# Patient Record
Sex: Female | Born: 1980 | Race: White | Hispanic: No | Marital: Married | Smoking: Never smoker
Health system: Southern US, Community
[De-identification: ages and names within clinical notes are randomized; demographics above are authoritative.]

## PROBLEM LIST (undated history)

## (undated) DIAGNOSIS — R51 Headache: Secondary | ICD-10-CM

## (undated) DIAGNOSIS — E079 Disorder of thyroid, unspecified: Secondary | ICD-10-CM

## (undated) DIAGNOSIS — I493 Ventricular premature depolarization: Principal | ICD-10-CM

## (undated) DIAGNOSIS — R202 Paresthesia of skin: Secondary | ICD-10-CM

## (undated) DIAGNOSIS — R200 Anesthesia of skin: Secondary | ICD-10-CM

## (undated) DIAGNOSIS — R519 Headache, unspecified: Secondary | ICD-10-CM

## (undated) DIAGNOSIS — O09529 Supervision of elderly multigravida, unspecified trimester: Secondary | ICD-10-CM

## (undated) DIAGNOSIS — O24419 Gestational diabetes mellitus in pregnancy, unspecified control: Secondary | ICD-10-CM

## (undated) DIAGNOSIS — T4145XA Adverse effect of unspecified anesthetic, initial encounter: Secondary | ICD-10-CM

## (undated) DIAGNOSIS — R002 Palpitations: Secondary | ICD-10-CM

## (undated) HISTORY — DX: Disorder of thyroid, unspecified: E07.9

## (undated) HISTORY — PX: MOUTH SURGERY: SHX715

## (undated) HISTORY — DX: Ventricular premature depolarization: I49.3

## (undated) HISTORY — PX: WISDOM TOOTH EXTRACTION: SHX21

## (undated) HISTORY — DX: Palpitations: R00.2

## (undated) HISTORY — PX: DILATION AND CURETTAGE OF UTERUS: SHX78

---

## 2000-01-24 ENCOUNTER — Other Ambulatory Visit: Admission: RE | Admit: 2000-01-24 | Discharge: 2000-01-24 | Payer: Self-pay | Admitting: *Deleted

## 2000-02-23 ENCOUNTER — Encounter (INDEPENDENT_AMBULATORY_CARE_PROVIDER_SITE_OTHER): Payer: Self-pay

## 2000-02-23 ENCOUNTER — Ambulatory Visit (HOSPITAL_COMMUNITY): Admission: RE | Admit: 2000-02-23 | Discharge: 2000-02-23 | Payer: Self-pay | Admitting: Obstetrics and Gynecology

## 2000-05-29 ENCOUNTER — Emergency Department (HOSPITAL_COMMUNITY): Admission: EM | Admit: 2000-05-29 | Discharge: 2000-05-29 | Payer: Self-pay | Admitting: Emergency Medicine

## 2000-05-29 ENCOUNTER — Encounter: Payer: Self-pay | Admitting: Emergency Medicine

## 2006-04-24 DIAGNOSIS — T8859XA Other complications of anesthesia, initial encounter: Secondary | ICD-10-CM

## 2006-04-24 HISTORY — DX: Other complications of anesthesia, initial encounter: T88.59XA

## 2009-10-25 ENCOUNTER — Emergency Department (HOSPITAL_COMMUNITY): Admission: EM | Admit: 2009-10-25 | Discharge: 2009-10-25 | Payer: Self-pay | Admitting: Emergency Medicine

## 2009-10-27 ENCOUNTER — Observation Stay (HOSPITAL_COMMUNITY): Admission: EM | Admit: 2009-10-27 | Discharge: 2009-10-27 | Payer: Self-pay | Admitting: Emergency Medicine

## 2010-07-10 LAB — POCT I-STAT, CHEM 8
BUN: 13 mg/dL (ref 6–23)
Calcium, Ion: 1.17 mmol/L (ref 1.12–1.32)
Chloride: 105 mEq/L (ref 96–112)
Creatinine, Ser: 0.8 mg/dL (ref 0.4–1.2)
Glucose, Bld: 104 mg/dL — ABNORMAL HIGH (ref 70–99)
HCT: 41 % (ref 36.0–46.0)
Hemoglobin: 13.9 g/dL (ref 12.0–15.0)
Potassium: 4.5 mEq/L (ref 3.5–5.1)
Sodium: 139 mEq/L (ref 135–145)
TCO2: 25 mmol/L (ref 0–100)

## 2010-07-10 LAB — CBC
HCT: 37.2 % (ref 36.0–46.0)
Hemoglobin: 12.8 g/dL (ref 12.0–15.0)
MCH: 31 pg (ref 26.0–34.0)
MCHC: 34.4 g/dL (ref 30.0–36.0)
MCV: 90.3 fL (ref 78.0–100.0)
Platelets: 267 10*3/uL (ref 150–400)
RBC: 4.13 MIL/uL (ref 3.87–5.11)
RDW: 12.6 % (ref 11.5–15.5)
WBC: 17.5 10*3/uL — ABNORMAL HIGH (ref 4.0–10.5)

## 2010-07-10 LAB — DIFFERENTIAL
Basophils Absolute: 0 10*3/uL (ref 0.0–0.1)
Basophils Relative: 0 % (ref 0–1)
Eosinophils Absolute: 0.1 10*3/uL (ref 0.0–0.7)
Eosinophils Relative: 1 % (ref 0–5)
Lymphocytes Relative: 19 % (ref 12–46)
Lymphs Abs: 3.3 10*3/uL (ref 0.7–4.0)
Monocytes Absolute: 1.1 10*3/uL — ABNORMAL HIGH (ref 0.1–1.0)
Monocytes Relative: 6 % (ref 3–12)
Neutro Abs: 12.9 10*3/uL — ABNORMAL HIGH (ref 1.7–7.7)
Neutrophils Relative %: 74 % (ref 43–77)

## 2010-07-10 LAB — PREGNANCY, URINE: Preg Test, Ur: NEGATIVE

## 2010-07-10 LAB — POCT PREGNANCY, URINE: Preg Test, Ur: NEGATIVE

## 2010-07-20 ENCOUNTER — Encounter: Payer: Medicaid Other | Attending: Obstetrics and Gynecology

## 2010-07-20 DIAGNOSIS — Z713 Dietary counseling and surveillance: Secondary | ICD-10-CM | POA: Insufficient documentation

## 2010-07-20 DIAGNOSIS — O99810 Abnormal glucose complicating pregnancy: Secondary | ICD-10-CM | POA: Insufficient documentation

## 2010-08-31 ENCOUNTER — Inpatient Hospital Stay (HOSPITAL_COMMUNITY)
Admission: AD | Admit: 2010-08-31 | Discharge: 2010-08-31 | Disposition: A | Discharge status: Home / Self Care | Payer: Managed Care, Other (non HMO) | Source: Ambulatory Visit | Attending: Obstetrics and Gynecology | Admitting: Obstetrics and Gynecology

## 2010-08-31 DIAGNOSIS — O479 False labor, unspecified: Secondary | ICD-10-CM | POA: Insufficient documentation

## 2010-09-06 ENCOUNTER — Inpatient Hospital Stay (HOSPITAL_COMMUNITY)
Admission: RE | Admit: 2010-09-06 | Discharge: 2010-09-08 | DRG: 775 | Disposition: A | Discharge status: Home / Self Care | Payer: Managed Care, Other (non HMO) | Source: Ambulatory Visit | Attending: Obstetrics and Gynecology | Admitting: Obstetrics and Gynecology

## 2010-09-06 DIAGNOSIS — O99814 Abnormal glucose complicating childbirth: Principal | ICD-10-CM | POA: Diagnosis present

## 2010-09-06 LAB — CBC
HCT: 37.8 % (ref 36.0–46.0)
MCV: 92.2 fL (ref 78.0–100.0)
RBC: 4.1 MIL/uL (ref 3.87–5.11)
RDW: 13.4 % (ref 11.5–15.5)
WBC: 12.5 10*3/uL — ABNORMAL HIGH (ref 4.0–10.5)

## 2010-09-06 LAB — RPR: RPR Ser Ql: NONREACTIVE

## 2010-09-07 LAB — CBC
HCT: 32.2 % — ABNORMAL LOW (ref 36.0–46.0)
MCV: 92.3 fL (ref 78.0–100.0)
Platelets: 184 10*3/uL (ref 150–400)
RBC: 3.49 MIL/uL — ABNORMAL LOW (ref 3.87–5.11)
RDW: 13.5 % (ref 11.5–15.5)
WBC: 17.2 10*3/uL — ABNORMAL HIGH (ref 4.0–10.5)

## 2010-09-09 NOTE — H&P (Signed)
[facility]  Patient:    Donna Rivera, Donna Rivera                 MRN: [id number] Adm. Date:  [id number] Attending:  Lenoard Aden                         History and Physical  PREOPERATIVE DIAGNOSIS:       Missed Ab at 10 weeks.  HISTORY OF PRESENT ILLNESS:   Patient is a 30 year old white female, G2, P0 -- EDD is Sep 03, 2000 by LMP -- who, on ultrasound, has obvious blighted ovum and anembryonic gestation.  She is for definitive therapy.  PAST MEDICAL HISTORY:         Noncontributory.  MEDICATIONS:                  History of Naprosyn use for menstrual cramps.  ALLERGIES:                    ASPIRIN.  SOCIAL HISTORY:               She is a nonsmoker and nondrinker.  She denies domestic or physical violence.  FAMILY HISTORY:               Noncontributory.  PHYSICAL EXAMINATION  GENERAL:                      Well-developed, well-nourished white female in no apparent distress.  HEENT:                        Normal.  LUNGS:                        Clear.  HEART:                        Regular rhythm.  ABDOMEN:                      Soft, nontender.  Uterus 6 to 8 weeks size. No adnexal masses appreciated.  IMPRESSION:                   Missed abortion.  PLAN:                         Proceed with suction/D&E.  Risks of anesthesia, infection, bleeding, uterine perforation with need for repair are discussed; injury to abdominal organs to include bowel and bladder injury with need for repair and possible need for blood transfusion noted; patient acknowledges and desires to proceed. DD:  02/23/00 TD:  02/23/00 Job: [id number]

## 2010-09-09 NOTE — Op Note (Signed)
[facility]  Patient:    Donna Rivera, FLEIG                   MRN: [id number] Proc. Date: 02/23/00 Attending:  Lenoard Aden, M.D.                           Operative Report  PREOPERATIVE DIAGNOSIS:       Missed abortion at 10 weeks.  POSTOPERATIVE DIAGNOSIS:      Missed abortion at 10 weeks.  PROCEDURE:                    Suction dilation and evacuation.  SURGEON:                      Lenoard Aden, M.D.  ANESTHESIA:                   MAC, paracervical.  ESTIMATED BLOOD LOSS:         Less than 50 cc.  COMPLICATIONS:                None.  DRAIN:                        None.  COUNTS:                       Correct.  Patient recovering in good condition.  BRIEF NOTE:                   After being apprised of the risks of anesthesia, infection, bleeding, uterine perforation, ______ patient presents to the operating room where she is administered IV sedation without difficulty, prepped and draped in the usual sterile fashion.  Examination under anesthesia reveals a mid positioned 8 weeks sized uterus.  No adnexal masses.  Patient is catheterized until bladder is empty.  Vivelle speculum placed.  Paracervical block placed in a standard fashion with 20 cc 2% Xylocaine solution.  No complications.  Uterus sounds to 10 cm.  Dilated up to #[facility] dilator.  A 7 mm suction curette placed.  Products of conception noted on suction.  Minimal tissue noted.  At this time good hemostasis achieved.  Blunt curettage in a four quadrant method reveals the cavity to be empty.  Repeat suction and curettage confirms.  Patient tolerates procedure well.  Estimated blood loss less than 50 cc.  Transferred to recovery in good condition. DD:  02/23/00 TD:  02/23/00 Job: [id number]

## 2010-09-14 ENCOUNTER — Inpatient Hospital Stay (HOSPITAL_COMMUNITY): Admission: AD | Admit: 2010-09-14 | Payer: Self-pay | Admitting: Obstetrics and Gynecology

## 2011-12-24 ENCOUNTER — Ambulatory Visit: Payer: Managed Care, Other (non HMO)

## 2012-10-03 ENCOUNTER — Ambulatory Visit (INDEPENDENT_AMBULATORY_CARE_PROVIDER_SITE_OTHER): Payer: Managed Care, Other (non HMO) | Admitting: Internal Medicine

## 2012-10-03 ENCOUNTER — Encounter: Payer: Self-pay | Admitting: Internal Medicine

## 2012-10-03 VITALS — BP 114/68 | HR 76 | Ht 65.0 in | Wt 190.8 lb

## 2012-10-03 DIAGNOSIS — R002 Palpitations: Secondary | ICD-10-CM

## 2012-10-03 NOTE — Assessment & Plan Note (Addendum)
The patient has very striking palpitations which are interestingly associated with a relatively slow heart rate. Is possible that she is under counseling and that she has SVT. I wonder also whether she might not have AI VR.  We'll undertake an event recorder to try to elucidate the mechanism. For right now I would not undertake therapy or other diagnostic testing.

## 2012-10-03 NOTE — Patient Instructions (Addendum)
Your physician has recommended that you wear an event monitor. Event monitors are medical devices that record the heart's electrical activity. Doctors most often [place] these monitors to diagnose arrhythmias. Arrhythmias are problems with the speed or rhythm of the heartbeat. The monitor is a small, portable device. You can wear one while you do your normal daily activities. This is usually used to diagnose what is causing palpitations/syncope (passing out).  Your physician recommends that you schedule a follow-up appointment in: 5-6 weeks

## 2012-10-03 NOTE — Progress Notes (Signed)
ELECTROPHYSIOLOGY CONSULT NOTE  Patient ID: Donna Rivera, MRN: [id number], DOB/AGE: [birth year] 32 y.o. Admit date: (Not on file) Date of Consult: 10/03/2012  Primary Physician: Cala Bradford, MD Primary Cardiologist: new   Chief Complaint: palpitations   HPI Donna Rivera is a 32 y.o. female   Whose  Grandfather and   grandmother I took care of years ago. She comes in with complaints of abrupt onset offset palpitations lasting about 5-10 minutes occurring a couple of times a month. These began about 5 years ago. Interestingly they did not occur while she was pregnant with her child. She has noted no association with caffeine or with exertion.  She has recorded her pulse at about 90-100 beats per minute with this. They are frog negative. She feels them coming primarily in the middle of her chest. They're associated with a sense of impending doom. There is mild lightheadedness modest shortness of breath but no chest discomfort.  She has a history of a thyroid disorder the specifics of which are not available.    No past medical history on file.    Surgical History: No past surgical history on file.   Home Meds: Prior to Admission medications   Medication Sig Start Date End Date Taking? Authorizing Provider  ibuprofen (ADVIL,MOTRIN) 800 MG tablet Take 800 mg by mouth as needed for pain.   Yes Historical Provider, MD  Norethindrone-Ethinyl Estradiol-Fe (GENERESS FE) 0.8-25 MG-MCG tablet Chew 1 tablet by mouth daily. Chew and Swallow one tab by mouth daily   Yes Historical Provider, MD      Allergies:  Allergies  Allergen Reactions  . Aspirin     Unknown reaction  . Penicillins     Unknown reaction    History   Social History  . Marital Status: Married    Spouse Name: N/A    Number of Children: N/A  . Years of Education: N/A   Occupational History  . Not on file.   Social History Main Topics  . Smoking status: Never Smoker   .  Smokeless tobacco: Not on file  . Alcohol Use: Not on file  . Drug Use: Not on file  . Sexually Active: Not on file   Other Topics Concern  . Not on file   Social History Narrative  . No narrative on file     No family history on file.   ROS:  Please see the history of present illness.     All other systems reviewed and negative.    Physical Exam: Blood pressure 114/68, pulse 76, height 5\' 5"  (1.651 m), weight 190 lb 12.8 oz (86.546 kg). General: Well developed, well nourished female in no acute distress. Head: Normocephalic, atraumatic, sclera non-icteric, no xanthomas, nares are without discharge. EENT: normal Lymph Nodes:  none Back: without scoliosis/kyphosis, no CVA tendersness Neck: Negative for carotid bruits. JVD not elevated. Lungs: Clear bilaterally to auscultation without wheezes, rales, or rhonchi. Breathing is unlabored. Heart: RRR with S1 S2. No murmur , rubs, or gallops appreciated. Abdomen: Soft, non-tender, non-distended with normoactive bowel sounds. No hepatomegaly. No rebound/guarding. No obvious abdominal masses. Msk:  Strength and tone appear normal for age. Extremities: No clubbing or cyanosis. No edema.  Distal pedal pulses are 2+ and equal bilaterally. Skin: Warm and Dry Neuro: Alert and oriented X 3. CN III-XII intact Grossly normal sensory and motor function . Psych:  Responds to questions appropriately with a normal affect.      Labs: Cardiac Enzymes No results found  for this basename: CKTOTAL, CKMB, TROPONINI,  in the last 72 hours CBC Lab Results  Component Value Date   WBC 17.2* 09/07/2010   HGB 10.6* 09/07/2010   HCT 32.2* 09/07/2010   MCV 92.3 09/07/2010   PLT 184 09/07/2010     Radiology/Studies:  No results found.  ZOX:WRUEA rhythm at 76 Intervals 03/31/36 Axis LXXXVIII  suggestion no clear evidence of ventricular preexcitation   Assessment and Plan:    Sherryl Manges

## 2012-10-04 ENCOUNTER — Telehealth: Payer: Self-pay | Admitting: Internal Medicine

## 2012-10-04 NOTE — Telephone Encounter (Signed)
New Problem:    Patient called in wanting to let Dr. Graciela Husbands know that after her appointment yesterday she had another episode where her BP was 140/90 but her HR was 142 and it lasted for an hour.  Please call back.

## 2012-10-04 NOTE — Telephone Encounter (Signed)
LMTCB

## 2012-10-04 NOTE — Telephone Encounter (Signed)
**Note De-Identified Daundre Biel Obfuscation** Pt states that after her OV with Dr Graciela Husbands yesterday her BP became elevated at 140/90 with a HR of 142 and that her heart was pounding hard and she felt light headed. She states that she sat down and rested for a while and symptoms subsided. Pt reports her BP this am @ 4:38 was  131/70 with a HR of 88, @ 6:46 BP was 124/75 with a HR of 78 and @ 8:15 BP was 141/80 with a HR of 79. Pt states that she feels ok at this time. Pt is advised to monitor her BP, record readings and to call office if this occurs again and that if this occurs in the evening hours or over the weekend to call 911 or have someone drive her to ER, she verbalized understanding.

## 2012-10-10 ENCOUNTER — Encounter: Payer: Self-pay | Admitting: *Deleted

## 2012-10-10 ENCOUNTER — Encounter (INDEPENDENT_AMBULATORY_CARE_PROVIDER_SITE_OTHER): Payer: Managed Care, Other (non HMO)

## 2012-10-10 DIAGNOSIS — R002 Palpitations: Secondary | ICD-10-CM

## 2012-10-10 NOTE — Progress Notes (Signed)
Patient ID: Donna Rivera, female   DOB: [birth year], 32 y.o.   MRN: [id number] E-Cardio verite 30 day telemetry monitor placed on patient.

## 2012-11-08 ENCOUNTER — Ambulatory Visit: Payer: Managed Care, Other (non HMO) | Admitting: Internal Medicine

## 2012-11-11 ENCOUNTER — Encounter: Payer: Self-pay | Admitting: *Deleted

## 2012-11-12 ENCOUNTER — Ambulatory Visit (INDEPENDENT_AMBULATORY_CARE_PROVIDER_SITE_OTHER): Payer: Managed Care, Other (non HMO) | Admitting: Internal Medicine

## 2012-11-12 ENCOUNTER — Encounter: Payer: Self-pay | Admitting: Internal Medicine

## 2012-11-12 VITALS — BP 143/72 | HR 80 | Ht 63.0 in | Wt 189.8 lb

## 2012-11-12 DIAGNOSIS — I491 Atrial premature depolarization: Secondary | ICD-10-CM | POA: Insufficient documentation

## 2012-11-12 DIAGNOSIS — I493 Ventricular premature depolarization: Secondary | ICD-10-CM

## 2012-11-12 DIAGNOSIS — I4949 Other premature depolarization: Secondary | ICD-10-CM

## 2012-11-12 DIAGNOSIS — R002 Palpitations: Secondary | ICD-10-CM

## 2012-11-12 HISTORY — DX: Ventricular premature depolarization: I49.3

## 2012-11-12 NOTE — Assessment & Plan Note (Signed)
As above.

## 2012-11-12 NOTE — Assessment & Plan Note (Signed)
Symptomatic Reassured  Discussed relation to stress and caffeince

## 2012-11-12 NOTE — Progress Notes (Signed)
Donna Rivera Patient Care Team: Cala Bradford, MD as PCP - General (Family Medicine)   HPI  SACHE SANE is a 32 y.o. female Seen in followup for palpitations in the setting of a slow baseline heart rate. Event recorder was utilized.  There are some issues with event recorder function; however we did get to recent data. Her multiple episodes of flutter associated with PVCs and PACs. No significant tachycardia was identified   Past Medical History  Diagnosis Date  . Thyroid disorder     Specifics not available not on Synthroid  . Palpitations     No past surgical history on file.  Current Outpatient Prescriptions  Medication Sig Dispense Refill  . ibuprofen (ADVIL,MOTRIN) 800 MG tablet Take 800 mg by mouth as needed for pain.      . Norethindrone-Ethinyl Estradiol-Fe (GENERESS FE) 0.8-25 MG-MCG tablet Chew 1 tablet by mouth daily. Chew and Swallow one tab by mouth daily       No current facility-administered medications for this visit.    Allergies  Allergen Reactions  . Aspirin     Unknown reaction  . Penicillins     Unknown reaction    Review of Systems negative except from HPI and PMH  Physical Exam BP 143/72  Pulse 80  Ht 5\' 3"  (1.6 m)  Wt 189 lb 12.8 oz (86.093 kg)  BMI 33.63 kg/m2 Well developed and well nourished in no acute distress HENT normal E scleral and icterus clear Neck Supple JVP flat; carotids brisk and full Clear to ausculation  Regular rate and rhythm, no murmurs gallops or rub Soft with active bowel sounds No clubbing cyanosis none Edema Alert and oriented, grossly normal motor and sensory function Skin Warm and Dry  Event recorder was reviewed as noted above. Symptomatic PACs rare couplets and patterns of trigeminy were noted  Assessment and  Plan

## 2013-04-24 NOTE — L&D Delivery Note (Signed)
SVD of VMI at 1732 on 01/19/14.  EBL 300cc.  Placenta to L&D.  APGARs 9,9. Head delivered LOA with loose nuchal x 1 reduced.  Body delivered atraumatically.  Mouth and nose bulb suctioned.  Cord was clamped, cut and baby to abdomen. Cord blood was obtained.  Placenta delivered S/I/3VC.  Fundus was firmed with pitocin and massage.  2nd degree perineal lac repaired with 3-0 Rapide in the normal fashion.  Mom and baby stable.   Mitchel Honour, DO

## 2013-06-13 LAB — OB RESULTS CONSOLE ABO/RH: RH Type: NEGATIVE

## 2013-06-13 LAB — OB RESULTS CONSOLE HEPATITIS B SURFACE ANTIGEN: HEP B S AG: NEGATIVE

## 2013-06-13 LAB — OB RESULTS CONSOLE RPR: RPR: NONREACTIVE

## 2013-06-13 LAB — OB RESULTS CONSOLE GC/CHLAMYDIA
CHLAMYDIA, DNA PROBE: NEGATIVE
Gonorrhea: NEGATIVE

## 2013-06-13 LAB — OB RESULTS CONSOLE HIV ANTIBODY (ROUTINE TESTING): HIV: NONREACTIVE

## 2013-06-13 LAB — OB RESULTS CONSOLE RUBELLA ANTIBODY, IGM: Rubella: IMMUNE

## 2013-06-13 LAB — OB RESULTS CONSOLE ANTIBODY SCREEN: Antibody Screen: NEGATIVE

## 2013-11-04 ENCOUNTER — Other Ambulatory Visit: Payer: Self-pay

## 2013-11-12 ENCOUNTER — Other Ambulatory Visit (HOSPITAL_COMMUNITY): Payer: Self-pay | Admitting: Obstetrics & Gynecology

## 2013-11-12 DIAGNOSIS — Z3689 Encounter for other specified antenatal screening: Secondary | ICD-10-CM

## 2013-11-12 DIAGNOSIS — O368390 Maternal care for abnormalities of the fetal heart rate or rhythm, unspecified trimester, not applicable or unspecified: Secondary | ICD-10-CM

## 2013-11-19 ENCOUNTER — Encounter (HOSPITAL_COMMUNITY): Payer: Self-pay

## 2013-11-19 ENCOUNTER — Ambulatory Visit (HOSPITAL_COMMUNITY)
Admission: RE | Admit: 2013-11-19 | Discharge: 2013-11-19 | Disposition: A | Discharge status: Home / Self Care | Payer: BC Managed Care – PPO | Source: Ambulatory Visit | Attending: Obstetrics & Gynecology | Admitting: Obstetrics & Gynecology

## 2013-11-19 DIAGNOSIS — Z3689 Encounter for other specified antenatal screening: Secondary | ICD-10-CM | POA: Diagnosis not present

## 2013-11-19 DIAGNOSIS — O368390 Maternal care for abnormalities of the fetal heart rate or rhythm, unspecified trimester, not applicable or unspecified: Secondary | ICD-10-CM | POA: Diagnosis present

## 2013-12-04 ENCOUNTER — Other Ambulatory Visit (HOSPITAL_COMMUNITY): Payer: Self-pay | Admitting: Obstetrics & Gynecology

## 2013-12-04 DIAGNOSIS — O99419 Diseases of the circulatory system complicating pregnancy, unspecified trimester: Secondary | ICD-10-CM

## 2013-12-04 DIAGNOSIS — O368390 Maternal care for abnormalities of the fetal heart rate or rhythm, unspecified trimester, not applicable or unspecified: Secondary | ICD-10-CM

## 2013-12-04 DIAGNOSIS — O09523 Supervision of elderly multigravida, third trimester: Secondary | ICD-10-CM

## 2013-12-04 DIAGNOSIS — I2510 Atherosclerotic heart disease of native coronary artery without angina pectoris: Secondary | ICD-10-CM

## 2013-12-17 ENCOUNTER — Ambulatory Visit (HOSPITAL_COMMUNITY)
Admission: RE | Admit: 2013-12-17 | Discharge: 2013-12-17 | Disposition: A | Discharge status: Home / Self Care | Payer: BC Managed Care – PPO | Source: Ambulatory Visit | Attending: Obstetrics & Gynecology | Admitting: Obstetrics & Gynecology

## 2013-12-17 ENCOUNTER — Encounter (HOSPITAL_COMMUNITY): Payer: Self-pay

## 2013-12-17 VITALS — BP 131/68 | HR 96 | Wt 209.5 lb

## 2013-12-17 DIAGNOSIS — O368390 Maternal care for abnormalities of the fetal heart rate or rhythm, unspecified trimester, not applicable or unspecified: Secondary | ICD-10-CM | POA: Insufficient documentation

## 2013-12-17 DIAGNOSIS — O09529 Supervision of elderly multigravida, unspecified trimester: Secondary | ICD-10-CM | POA: Diagnosis not present

## 2013-12-17 DIAGNOSIS — I2510 Atherosclerotic heart disease of native coronary artery without angina pectoris: Secondary | ICD-10-CM | POA: Insufficient documentation

## 2013-12-17 DIAGNOSIS — O99419 Diseases of the circulatory system complicating pregnancy, unspecified trimester: Secondary | ICD-10-CM

## 2013-12-17 DIAGNOSIS — Z3689 Encounter for other specified antenatal screening: Secondary | ICD-10-CM | POA: Diagnosis not present

## 2013-12-17 DIAGNOSIS — O24410 Gestational diabetes mellitus in pregnancy, diet controlled: Secondary | ICD-10-CM

## 2013-12-17 DIAGNOSIS — O09523 Supervision of elderly multigravida, third trimester: Secondary | ICD-10-CM

## 2013-12-17 HISTORY — DX: Supervision of elderly multigravida, unspecified trimester: O09.529

## 2013-12-17 HISTORY — DX: Gestational diabetes mellitus in pregnancy, unspecified control: O24.419

## 2013-12-22 LAB — OB RESULTS CONSOLE GBS: GBS: NEGATIVE

## 2014-01-13 ENCOUNTER — Telehealth (HOSPITAL_COMMUNITY): Payer: Self-pay | Admitting: *Deleted

## 2014-01-13 ENCOUNTER — Encounter (HOSPITAL_COMMUNITY): Payer: Self-pay | Admitting: *Deleted

## 2014-01-13 NOTE — Telephone Encounter (Signed)
Preadmission screen  

## 2014-01-19 ENCOUNTER — Encounter (HOSPITAL_COMMUNITY): Payer: BC Managed Care – PPO | Admitting: Anesthesiology

## 2014-01-19 ENCOUNTER — Inpatient Hospital Stay (HOSPITAL_COMMUNITY): Payer: BC Managed Care – PPO | Admitting: Anesthesiology

## 2014-01-19 ENCOUNTER — Inpatient Hospital Stay (HOSPITAL_COMMUNITY)
Admission: RE | Admit: 2014-01-19 | Discharge: 2014-01-21 | DRG: 775 | Disposition: A | Discharge status: Home / Self Care | Payer: BC Managed Care – PPO | Source: Ambulatory Visit | Attending: Obstetrics & Gynecology | Admitting: Obstetrics & Gynecology

## 2014-01-19 ENCOUNTER — Encounter (HOSPITAL_COMMUNITY): Payer: Self-pay

## 2014-01-19 DIAGNOSIS — Z823 Family history of stroke: Secondary | ICD-10-CM | POA: Diagnosis not present

## 2014-01-19 DIAGNOSIS — O99814 Abnormal glucose complicating childbirth: Secondary | ICD-10-CM | POA: Diagnosis present

## 2014-01-19 DIAGNOSIS — O09529 Supervision of elderly multigravida, unspecified trimester: Secondary | ICD-10-CM | POA: Diagnosis present

## 2014-01-19 DIAGNOSIS — Z833 Family history of diabetes mellitus: Secondary | ICD-10-CM | POA: Diagnosis not present

## 2014-01-19 DIAGNOSIS — Z8249 Family history of ischemic heart disease and other diseases of the circulatory system: Secondary | ICD-10-CM | POA: Diagnosis not present

## 2014-01-19 DIAGNOSIS — Z3490 Encounter for supervision of normal pregnancy, unspecified, unspecified trimester: Secondary | ICD-10-CM

## 2014-01-19 DIAGNOSIS — O99891 Other specified diseases and conditions complicating pregnancy: Secondary | ICD-10-CM | POA: Diagnosis present

## 2014-01-19 LAB — CBC
HCT: 35.9 % — ABNORMAL LOW (ref 36.0–46.0)
Hemoglobin: 12 g/dL (ref 12.0–15.0)
MCH: 30.8 pg (ref 26.0–34.0)
MCHC: 33.4 g/dL (ref 30.0–36.0)
MCV: 92.3 fL (ref 78.0–100.0)
PLATELETS: 227 10*3/uL (ref 150–400)
RBC: 3.89 MIL/uL (ref 3.87–5.11)
RDW: 13.6 % (ref 11.5–15.5)
WBC: 11.1 10*3/uL — AB (ref 4.0–10.5)

## 2014-01-19 LAB — RPR

## 2014-01-19 MED ORDER — ACETAMINOPHEN 325 MG PO TABS: 650.0000 mg | ORAL_TABLET | ORAL | Status: DC | PRN

## 2014-01-19 MED ORDER — OXYTOCIN 40 UNITS IN LACTATED RINGERS INFUSION - SIMPLE MED
1.0000 m[IU]/min | INTRAVENOUS | Status: DC
  Administered 2014-01-19: 2 m[IU]/min via INTRAVENOUS
  Filled 2014-01-19: qty 1000

## 2014-01-19 MED ORDER — ONDANSETRON HCL 4 MG/2ML IJ SOLN: 4.0000 mg | Freq: Four times a day (QID) | INTRAMUSCULAR | Status: DC | PRN

## 2014-01-19 MED ORDER — CITRIC ACID-SODIUM CITRATE 334-500 MG/5ML PO SOLN: 30.0000 mL | ORAL | Status: DC | PRN

## 2014-01-19 MED ORDER — LIDOCAINE HCL (PF) 1 % IJ SOLN
30.0000 mL | INTRAMUSCULAR | Status: DC | PRN
  Filled 2014-01-19: qty 30

## 2014-01-19 MED ORDER — OXYCODONE-ACETAMINOPHEN 5-325 MG PO TABS: 1.0000 | ORAL_TABLET | ORAL | Status: DC | PRN

## 2014-01-19 MED ORDER — SENNOSIDES-DOCUSATE SODIUM 8.6-50 MG PO TABS
2.0000 | ORAL_TABLET | ORAL | Status: DC
  Administered 2014-01-19 – 2014-01-21 (×2): 2 via ORAL
  Filled 2014-01-19 (×2): qty 2

## 2014-01-19 MED ORDER — PHENYLEPHRINE 40 MCG/ML (10ML) SYRINGE FOR IV PUSH (FOR BLOOD PRESSURE SUPPORT)
80.0000 ug | PREFILLED_SYRINGE | INTRAVENOUS | Status: DC | PRN
  Administered 2014-01-19 (×2): 80 ug via INTRAVENOUS
  Filled 2014-01-19: qty 2

## 2014-01-19 MED ORDER — ONDANSETRON HCL 4 MG PO TABS: 4.0000 mg | ORAL_TABLET | ORAL | Status: DC | PRN

## 2014-01-19 MED ORDER — ZOLPIDEM TARTRATE 5 MG PO TABS: 5.0000 mg | ORAL_TABLET | Freq: Every evening | ORAL | Status: DC | PRN

## 2014-01-19 MED ORDER — EPHEDRINE 5 MG/ML INJ
10.0000 mg | INTRAVENOUS | Status: DC | PRN
  Filled 2014-01-19: qty 2

## 2014-01-19 MED ORDER — OXYCODONE-ACETAMINOPHEN 5-325 MG PO TABS: 2.0000 | ORAL_TABLET | ORAL | Status: DC | PRN

## 2014-01-19 MED ORDER — PRENATAL MULTIVITAMIN CH
1.0000 | ORAL_TABLET | Freq: Every day | ORAL | Status: DC
  Administered 2014-01-20: 1 via ORAL
  Filled 2014-01-19: qty 1

## 2014-01-19 MED ORDER — OXYCODONE-ACETAMINOPHEN 5-325 MG PO TABS
1.0000 | ORAL_TABLET | ORAL | Status: DC | PRN
  Administered 2014-01-19 – 2014-01-21 (×5): 1 via ORAL
  Filled 2014-01-19 (×5): qty 1

## 2014-01-19 MED ORDER — TERBUTALINE SULFATE 1 MG/ML IJ SOLN: 0.2500 mg | Freq: Once | INTRAMUSCULAR | Status: DC | PRN

## 2014-01-19 MED ORDER — WITCH HAZEL-GLYCERIN EX PADS: 1.0000 "application " | MEDICATED_PAD | CUTANEOUS | Status: DC | PRN

## 2014-01-19 MED ORDER — DIBUCAINE 1 % RE OINT: 1.0000 "application " | TOPICAL_OINTMENT | RECTAL | Status: DC | PRN

## 2014-01-19 MED ORDER — DIPHENHYDRAMINE HCL 50 MG/ML IJ SOLN: 12.5000 mg | INTRAMUSCULAR | Status: DC | PRN

## 2014-01-19 MED ORDER — OXYTOCIN BOLUS FROM INFUSION
500.0000 mL | INTRAVENOUS | Status: DC
  Administered 2014-01-19: 500 mL via INTRAVENOUS

## 2014-01-19 MED ORDER — LACTATED RINGERS IV SOLN
500.0000 mL | Freq: Once | INTRAVENOUS | Status: AC
  Administered 2014-01-19: 500 mL via INTRAVENOUS

## 2014-01-19 MED ORDER — LIDOCAINE HCL (PF) 1 % IJ SOLN
INTRAMUSCULAR | Status: DC | PRN
  Administered 2014-01-19 (×5): 4 mL

## 2014-01-19 MED ORDER — SIMETHICONE 80 MG PO CHEW: 80.0000 mg | CHEWABLE_TABLET | ORAL | Status: DC | PRN

## 2014-01-19 MED ORDER — DIPHENHYDRAMINE HCL 25 MG PO CAPS: 25.0000 mg | ORAL_CAPSULE | Freq: Four times a day (QID) | ORAL | Status: DC | PRN

## 2014-01-19 MED ORDER — FENTANYL 2.5 MCG/ML BUPIVACAINE 1/10 % EPIDURAL INFUSION (WH - ANES)
14.0000 mL/h | INTRAMUSCULAR | Status: DC | PRN
  Administered 2014-01-19: 14 mL/h via EPIDURAL
  Filled 2014-01-19: qty 125

## 2014-01-19 MED ORDER — LACTATED RINGERS IV SOLN
INTRAVENOUS | Status: DC
  Administered 2014-01-19: 08:00:00 via INTRAVENOUS

## 2014-01-19 MED ORDER — ONDANSETRON HCL 4 MG/2ML IJ SOLN: 4.0000 mg | INTRAMUSCULAR | Status: DC | PRN

## 2014-01-19 MED ORDER — INFLUENZA VAC SPLIT QUAD 0.5 ML IM SUSY
0.5000 mL | PREFILLED_SYRINGE | INTRAMUSCULAR | Status: AC
  Administered 2014-01-20: 0.5 mL via INTRAMUSCULAR
  Filled 2014-01-19: qty 0.5

## 2014-01-19 MED ORDER — FLEET ENEMA 7-19 GM/118ML RE ENEM: 1.0000 | ENEMA | RECTAL | Status: DC | PRN

## 2014-01-19 MED ORDER — OXYTOCIN 40 UNITS IN LACTATED RINGERS INFUSION - SIMPLE MED
62.5000 mL/h | INTRAVENOUS | Status: DC
  Administered 2014-01-19: 62.5 mL/h via INTRAVENOUS

## 2014-01-19 MED ORDER — BENZOCAINE-MENTHOL 20-0.5 % EX AERO
1.0000 "application " | INHALATION_SPRAY | CUTANEOUS | Status: DC | PRN
  Filled 2014-01-19: qty 56

## 2014-01-19 MED ORDER — TETANUS-DIPHTH-ACELL PERTUSSIS 5-2.5-18.5 LF-MCG/0.5 IM SUSP: 0.5000 mL | Freq: Once | INTRAMUSCULAR | Status: DC

## 2014-01-19 MED ORDER — LANOLIN HYDROUS EX OINT: TOPICAL_OINTMENT | CUTANEOUS | Status: DC | PRN

## 2014-01-19 MED ORDER — PHENYLEPHRINE 40 MCG/ML (10ML) SYRINGE FOR IV PUSH (FOR BLOOD PRESSURE SUPPORT)
80.0000 ug | PREFILLED_SYRINGE | INTRAVENOUS | Status: DC | PRN
  Filled 2014-01-19: qty 10
  Filled 2014-01-19: qty 2

## 2014-01-19 MED ORDER — LACTATED RINGERS IV SOLN
500.0000 mL | INTRAVENOUS | Status: DC | PRN
  Administered 2014-01-19: 500 mL via INTRAVENOUS

## 2014-01-19 NOTE — Anesthesia Preprocedure Evaluation (Signed)
Anesthesia Evaluation  Patient identified by MRN, date of birth, ID band Patient awake    Reviewed: Allergy & Precautions, H&P , NPO status , Patient's Chart, lab work & pertinent test results, reviewed documented beta blocker date and time   History of Anesthesia Complications Negative for: history of anesthetic complications  Airway Mallampati: I TM Distance: >3 FB Neck ROM: full    Dental  (+) Teeth Intact   Pulmonary neg pulmonary ROS,  breath sounds clear to auscultation        Cardiovascular + dysrhythmias (palpitations - PVCs/PACs, last EKG 6/14 was normal) Rhythm:regular Rate:Normal     Neuro/Psych negative neurological ROS  negative psych ROS   GI/Hepatic negative GI ROS, Neg liver ROS,   Endo/Other  negative endocrine ROSdiabetes, Gestational  Renal/GU negative Renal ROS     Musculoskeletal   Abdominal   Peds  Hematology negative hematology ROS (+)   Anesthesia Other Findings   Reproductive/Obstetrics (+) Pregnancy                           Anesthesia Physical Anesthesia Plan  ASA: II  Anesthesia Plan: Epidural   Post-op Pain Management:    Induction:   Airway Management Planned:   Additional Equipment:   Intra-op Plan:   Post-operative Plan:   Informed Consent: I have reviewed the patients History and Physical, chart, labs and discussed the procedure including the risks, benefits and alternatives for the proposed anesthesia with the patient or authorized representative who has indicated his/her understanding and acceptance.     Plan Discussed with:   Anesthesia Plan Comments:         Anesthesia Quick Evaluation

## 2014-01-19 NOTE — Progress Notes (Signed)
RN had patient up to bathroom via stedy to assess urine output & bleeding. Patient stated she was unable to void. Several stimuli provided to encourage voiding (water running, warm water to urethral area, rubbing bladder). Patient continued feel unable to void. Patient then stated she felt faint & nauseous, becoming pale in face. Ammonia stick placed under patient nose. Other RN assistance requested. Patient then moved from commode to wheelchair. Drink provided to patient, BPs assessed. BPs were trending down, 500cc IV fluid bolus started. Once patient feeling more stable, moved back to bed. Bleeding reassessed, scant. I&O catheter performed, bleeding still WNL. IV fluid bolus continuing, BPs continuing to be assessed and trending up. Will let patient eat then transfer to [facility].

## 2014-01-19 NOTE — Anesthesia Procedure Notes (Signed)
Epidural Patient location during procedure: OB Start time: 01/19/2014 12:11 PM  Staffing Performed by: anesthesiologist   Preanesthetic Checklist Completed: patient identified, site marked, surgical consent, pre-op evaluation, timeout performed, IV checked, risks and benefits discussed and monitors and equipment checked  Epidural Patient position: sitting Prep: site prepped and draped and DuraPrep Patient monitoring: continuous pulse ox and blood pressure Approach: midline Injection technique: LOR air  Needle:  Needle type: Tuohy  Needle gauge: 17 G Needle length: 9 cm and 9 Needle insertion depth: 6 cm Catheter type: closed end flexible Catheter size: 19 Gauge Catheter at skin depth: 11 cm Test dose: negative  Assessment Events: blood aspirated, injection not painful, no injection resistance, positive IV test and no paresthesia  Additional Notes Discussed risk of headache, infection, bleeding, nerve injury and failed or incomplete block.  Patient voices understanding and wishes to proceed.  Epidural placed in two attempts.  First epidural had positive test dose - "stuffiness" feeling in one ear with subsequent +heme aspirated.  Removed and replaced at same level with -aspiration and -test dose.  No paresthesia.  Patient tolerated procedure well. Jasmine December, MDReason for block:procedure for pain

## 2014-01-19 NOTE — H&P (Signed)
Donna Rivera is a 33 y.o. female presenting for IOL.  Antepartum course complicated by A1DM, normal growth.  Fetal arrhythmia was noted mid-pregnancy and MFM referral was made; structurally normal appearing heart and no arrhythmia on their exam.  Patient does want pp BTL.  Maternal Medical History:  Contractions: Onset was 1 week ago.   Frequency: rare.   Perceived severity is mild.    Fetal activity: Perceived fetal activity is normal.   Last perceived fetal movement was within the past hour.    Prenatal Complications - Diabetes: gestational. Diabetes is managed by diet.      OB History   Grav Para Term Preterm Abortions TAB SAB Ect Mult Living   0 0 0 1     Past Medical History  Diagnosis Date  . Thyroid disorder     Specifics not available not on Synthroid  . Palpitations   . PVC (premature ventricular contraction) 11/12/2012  . Gestational diabetes   . AMA (advanced maternal age) multigravida 35+    Past Surgical History  Procedure Laterality Date  . Dilation and curettage of uterus     Family History: family history includes Anxiety disorder in her sister; Cancer in her paternal grandfather and paternal grandmother; Diabetes in her paternal grandfather; Heart disease in her paternal grandfather; Hypertension in her father; Stroke in her paternal aunt. Social History:  reports that she has never smoked. She does not have any smokeless tobacco history on file. She reports that she does not drink alcohol or use illicit drugs.   Prenatal Transfer Tool  Maternal Diabetes: Yes:  Diabetes Type:  Diet controlled Genetic Screening: Normal Maternal Ultrasounds/Referrals: Abnormal:  Findings:   Other: for arrhythmia; normal on MFM exam Fetal Ultrasounds or other Referrals:  Referred to Materal Fetal Medicine  Maternal Substance Abuse:  No Significant Maternal Medications:  None Significant Maternal Lab Results:  Lab values include: Group B Strep negative Other  Comments:  None  ROS  Dilation: 3 Effacement (%): 50 Station: -2 Exam by:: Dr Langston Masker Blood pressure 146/84, pulse 94, temperature 97.7 F (36.5 C), temperature source Oral, resp. rate 20, height  (1.651 m), weight 213 lb (96.616 kg). Maternal Exam:  Uterine Assessment: Contraction strength is mild.  Contraction frequency is rare.   Abdomen: Patient reports no abdominal tenderness. Fundal height is c/w dates.   Estimated fetal weight is 7#8.   Fetal presentation: vertex  Introitus: Normal vulva. Pelvis: adequate for delivery.   Cervix: Cervix evaluated by digital exam.     Physical Exam  Constitutional: She is oriented to person, place, and time. She appears well-developed and well-nourished.  GI: Soft. There is no rebound and no guarding.  Neurological: She is alert and oriented to person, place, and time.  Skin: Skin is warm and dry.  Psychiatric: She has a normal mood and affect. Her behavior is normal.    Prenatal labs: ABO, Rh: A/Negative/-- (02/20 0000) Antibody: Negative (02/20 0000) Rubella: Immune (02/20 0000) RPR: Nonreactive (02/20 0000)  HBsAg: Negative (02/20 0000)  HIV: Non-reactive (02/20 0000)  GBS: Negative (08/31 0000)   Assessment/Plan: 33yo [id number] at [redacted]w[redacted]d for IOL -AROM performed -Add pitocin prn -Epidural when ready   Swetha Rayle 01/19/2014, 8:09 AM

## 2014-01-20 LAB — CBC
HCT: 34.4 % — ABNORMAL LOW (ref 36.0–46.0)
Hemoglobin: 11.4 g/dL — ABNORMAL LOW (ref 12.0–15.0)
MCH: 30.5 pg (ref 26.0–34.0)
MCHC: 33.1 g/dL (ref 30.0–36.0)
MCV: 92 fL (ref 78.0–100.0)
Platelets: 198 K/uL (ref 150–400)
RBC: 3.74 MIL/uL — ABNORMAL LOW (ref 3.87–5.11)
RDW: 13.7 % (ref 11.5–15.5)
WBC: 15.5 K/uL — ABNORMAL HIGH (ref 4.0–10.5)

## 2014-01-20 LAB — MRSA PCR SCREENING: MRSA by PCR: NEGATIVE

## 2014-01-20 MED ORDER — FAMOTIDINE 20 MG PO TABS: 40.0000 mg | ORAL_TABLET | Freq: Once | ORAL | Status: DC

## 2014-01-20 MED ORDER — LACTATED RINGERS IV SOLN: INTRAVENOUS | Status: DC

## 2014-01-20 MED ORDER — METOCLOPRAMIDE HCL 10 MG PO TABS: 10.0000 mg | ORAL_TABLET | Freq: Once | ORAL | Status: DC

## 2014-01-20 MED ORDER — RHO D IMMUNE GLOBULIN 1500 UNIT/2ML IJ SOSY
300.0000 ug | PREFILLED_SYRINGE | Freq: Once | INTRAMUSCULAR | Status: AC
  Administered 2014-01-20: 300 ug via INTRAMUSCULAR
  Filled 2014-01-20: qty 2

## 2014-01-20 NOTE — Progress Notes (Signed)
Post Partum Day 1 Subjective: no complaints, up ad lib, voiding, tolerating PO and + flatus  Objective: Blood pressure 114/65, pulse 75, temperature 98.1 F (36.7 C), temperature source Oral, resp. rate 16, height 5\' 5"  (1.651 m), weight 213 lb (96.616 kg), SpO2 98.00%, unknown if currently breastfeeding.  Physical Exam:  General: alert and cooperative Lochia: appropriate Uterine Fundus: firm Incision: perineum intact DVT Evaluation: No evidence of DVT seen on physical exam. Negative Homan's sign. No cords or calf tenderness. No significant calf/ankle edema.   Recent Labs  01/19/14 0816 01/20/14 0625  HGB 12.0 11.4*  HCT 35.9* 34.4*    Assessment/Plan: Plan for discharge tomorrow Plans outpatient circ   LOS: 1 day   CURTIS,CAROL G 01/20/2014, 8:28 AM

## 2014-01-20 NOTE — Anesthesia Postprocedure Evaluation (Signed)
Anesthesia Post Note  Patient: Donna Rivera Endo  Procedure(s) Performed: * No procedures listed *  Anesthesia type: Epidural  Patient location: Mother/Baby  Post pain: Pain level controlled  Post assessment: Post-op Vital signs reviewed  Last Vitals:  Filed Vitals:   01/20/14 0200  BP: 114/65  Pulse: 75  Temp: 36.7 C  Resp: 16    Post vital signs: Reviewed  Level of consciousness:alert  Complications: No apparent anesthesia complications

## 2014-01-21 LAB — TYPE AND SCREEN
ABO/RH(D): A NEG
ANTIBODY SCREEN: POSITIVE
DAT, IgG: NEGATIVE
Unit division: 0
Unit division: 0

## 2014-01-21 LAB — RH IG WORKUP (INCLUDES ABO/RH)
ABO/RH(D): A NEG
FETAL SCREEN: NEGATIVE
GESTATIONAL AGE(WKS): 39
Unit division: 0

## 2014-01-21 MED ORDER — OXYCODONE-ACETAMINOPHEN 5-325 MG PO TABS: 1.0000 | ORAL_TABLET | ORAL | Status: DC | PRN

## 2014-01-21 NOTE — Discharge Summary (Signed)
Obstetric Discharge Summary Reason for Admission: induction of labor Prenatal Procedures: ultrasound Intrapartum Procedures: spontaneous vaginal delivery Postpartum Procedures: none Complications-Operative and Postpartum: 2 degree perineal laceration Hemoglobin  Date Value Ref Range Status  01/20/2014 11.4* 12.0 - 15.0 g/dL Final     HCT  Date Value Ref Range Status  01/20/2014 34.4* 36.0 - 46.0 % Final    Physical Exam:  General: alert and cooperative Lochia: appropriate Uterine Fundus: firm Incision: healing well DVT Evaluation: No evidence of DVT seen on physical exam. Negative Homan's sign. No cords or calf tenderness. Calf/Ankle edema is present.  Discharge Diagnoses: Term Pregnancy-delivered  Discharge Information: Date: 01/21/2014 Activity: pelvic rest Diet: routine Medications: PNV and Percocet Condition: stable Instructions: refer to practice specific booklet Discharge to: home   Newborn Data: Live born female  Birth Weight: 6 lb 15.8 oz (3170 g) APGAR: 8, 9  Home with mother.  Tiffaney Heimann G 01/21/2014, 8:40 AM

## 2014-01-22 ENCOUNTER — Inpatient Hospital Stay (HOSPITAL_COMMUNITY)
Admission: AD | Admit: 2014-01-22 | Payer: Managed Care, Other (non HMO) | Source: Ambulatory Visit | Admitting: Obstetrics and Gynecology

## 2014-02-23 ENCOUNTER — Encounter (HOSPITAL_COMMUNITY): Payer: Self-pay

## 2016-01-09 IMAGING — US US OB FOLLOW-UP
1 series · 12 of 28 positions shown · non-contrast
Comparison: none

[Series 1: us ob follow-up · 0.28mm/px · 12 of 39 slices shown]
[im 2/39]
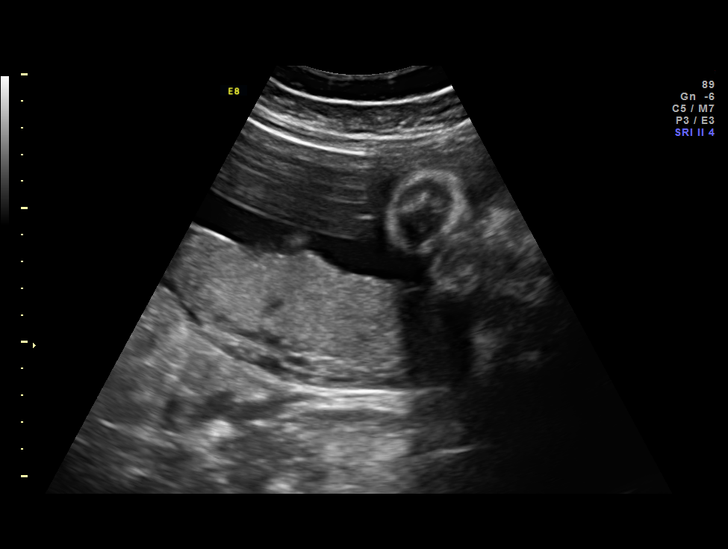
[im 5/39]
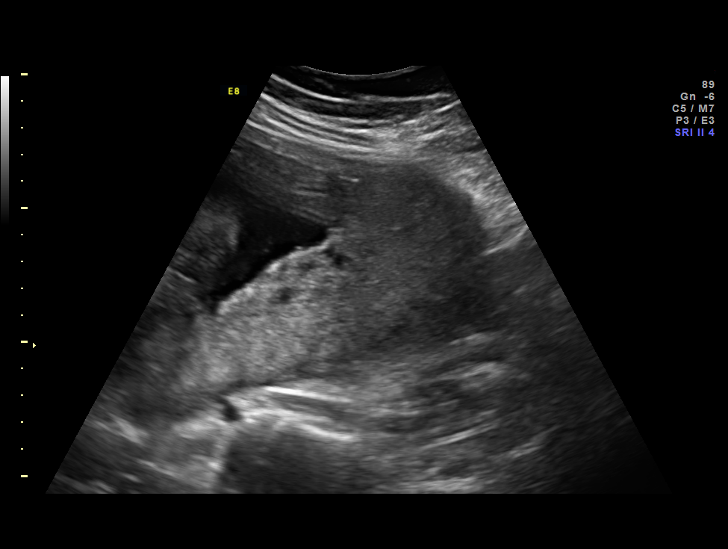
[im 8/39]
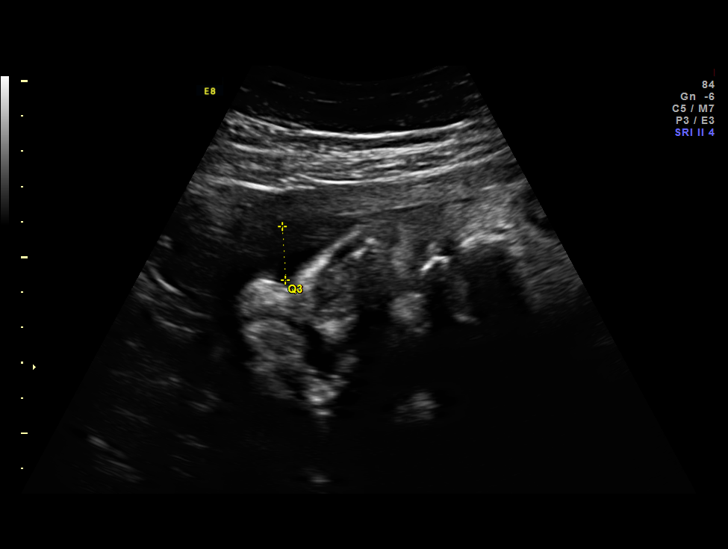
[im 12/39]
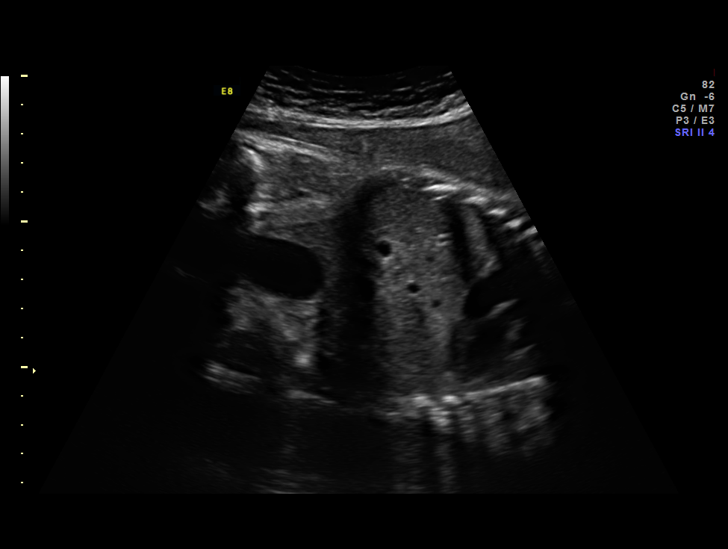
[im 15/39]
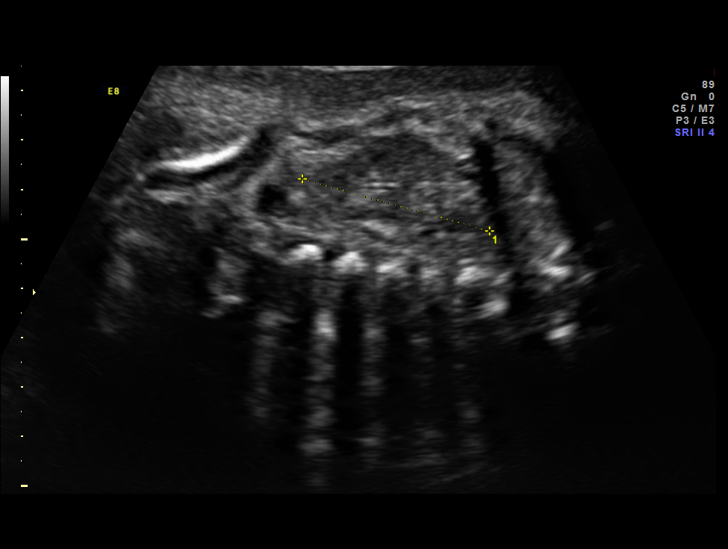
[im 17/39]
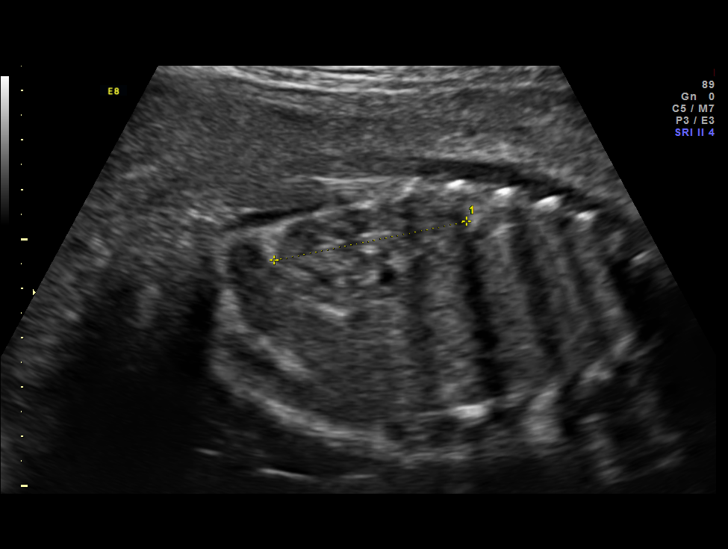
[im 22/39]
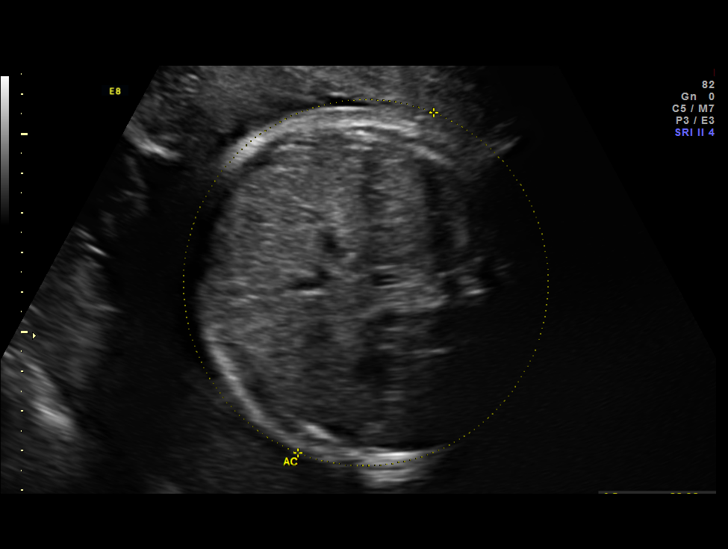
[im 24/39]
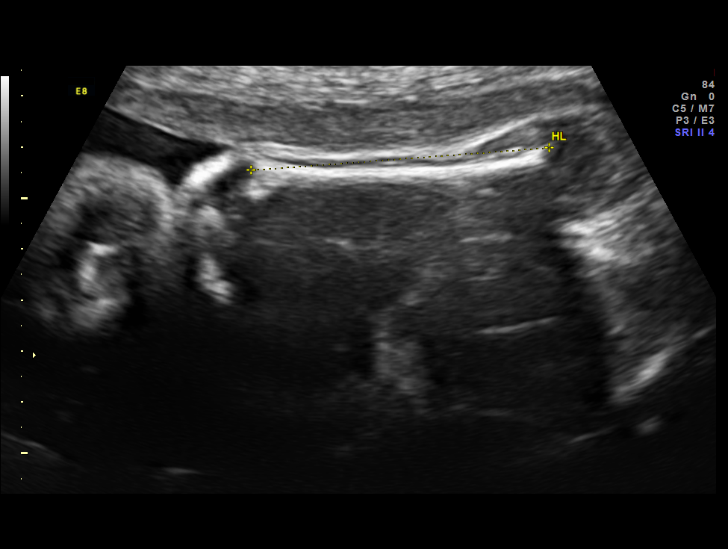
[im 27/39]
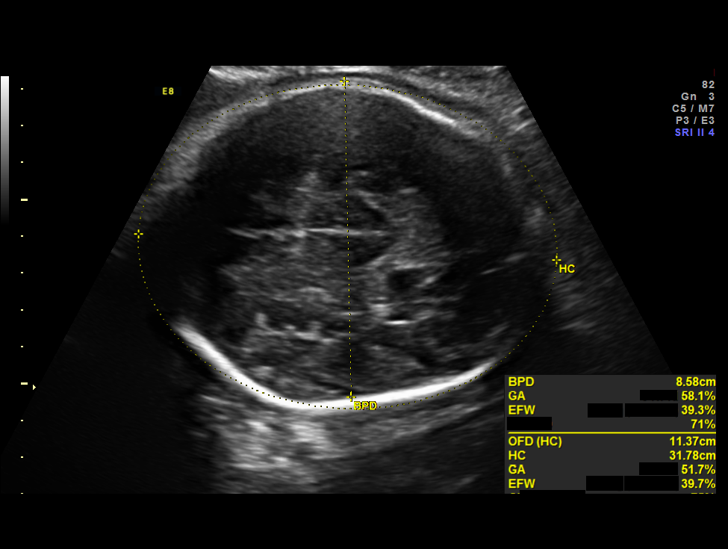
[im 31/39]
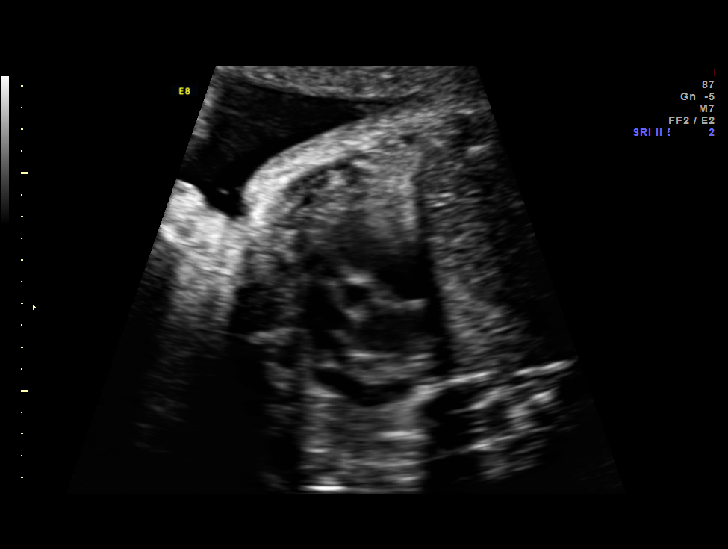
[im 34/39]
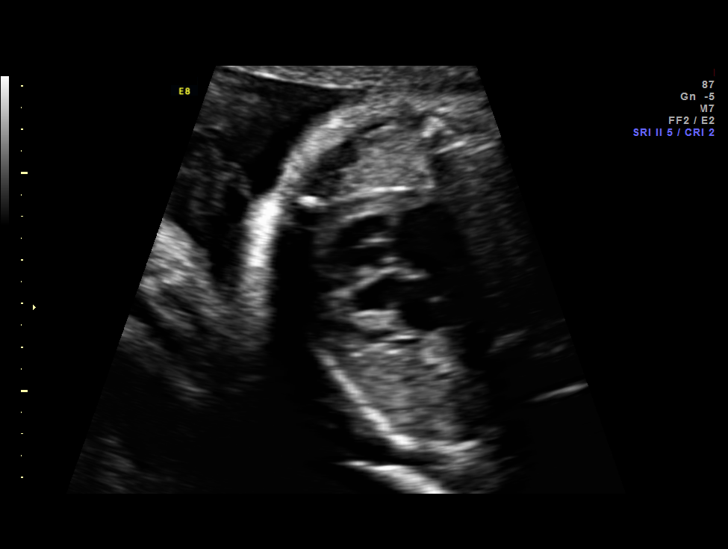
[im 37/39]
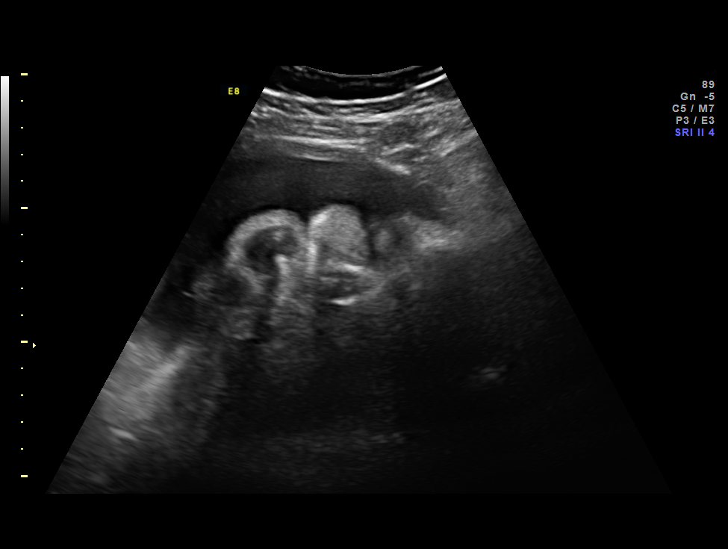

[12 of 28 positions shown; findings below may reference images not displayed]

OBSTETRICS REPORT
                      (Signed Final 12/17/2013 [DATE])

Service(s) Provided

 US OB FOLLOW UP                                       76816.1
Indications

 Fetal arrhythmia, not specified
 Advanced maternal age (33), Multigravida - low
 risk NIPS
 Diabetes - Gestational, A1 (diet controlled)
 Rh Negative
Fetal Evaluation

 Num Of Fetuses:    1
 Fetal Heart Rate:  142                          bpm
 Cardiac Activity:  Observed
 Presentation:      Cephalic
 Placenta:          Posterior, above cervical
                    os
 P. Cord            Previously Visualized
 Insertion:

 Amniotic Fluid
 AFI FV:      Subjectively within normal limits
 AFI Sum:     13.12   cm       42  %Tile     Larg Pckt:    4.55  cm
 RUQ:   4.09    cm   RLQ:    2.96   cm    LUQ:   4.55    cm   LLQ:    1.52   cm
Biometry

 BPD:     86.6  mm     G. Age:  35w 0d                CI:         76.5   70 - 86
 OFD:    113.2  mm                                    FL/HC:      19.6   19.4 -

 HC:     318.3  mm     G. Age:  35w 6d       53  %    HC/AC:      1.09   0.96 -

 AC:     292.1  mm     G. Age:  33w 1d       24  %    FL/BPD:     72.2   71 - 87
 FL:      62.5  mm     G. Age:  32w 2d        6  %    FL/AC:      21.4   20 - 24
 HUM:     58.6  mm     G. Age:  33w 6d       53  %

 Est. FW:    4924  gm    4 lb 13 oz      42  %
Gestational Age

 U/S Today:     34w 1d                                        EDD:   01/27/14
 Best:          34w 2d     Det. By:  Early Ultrasound         EDD:   01/26/14
                                     (06/06/13)
Anatomy

 Cranium:          Appears normal         Aortic Arch:      Previously seen
 Fetal Cavum:      Previously seen        Ductal Arch:      Previously seen
 Ventricles:       Previously seen        Diaphragm:        Appears normal
 Choroid Plexus:   Previously seen        Stomach:          Appears normal, left
                                                            sided
 Cerebellum:       Previously seen        Abdomen:          Previously seen
 Posterior Fossa:  Previously seen        Abdominal Wall:   Previously seen
 Nuchal Fold:      Not applicable (>20    Cord Vessels:     Previously seen
                   wks GA)
 Face:             Orbits nl; profile not Kidneys:          Appear normal
                   well visualized
 Lips:             Previously seen        Bladder:          Appears normal
 Heart:            Appears normal         Spine:            Previously seen
                   (4CH, axis, and
                   situs)
 RVOT:             Appears normal         Lower             Previously seen
                                          Extremities:
 LVOT:             Appears normal         Upper             Previously seen
                                          Extremities:

 Other:  Male gender.
Cervix Uterus Adnexa

 Cervix:       Not visualized (advanced GA >83wks)
Impression

 SIUP at 34+2 weeks
 Normal interval anatomy; anatomic survey complete except
 for the profile
 Normal amniotic fluid volume
 Appropriate interval growth with EFW at the 42nd %tile

 No fetal arrhythmia demonstrated today.
Recommendations

 Follow-up as clinically indicated

## 2016-08-23 NOTE — H&P (Signed)
Clearence Cheek  DICTATION # [id number] CSN# [id number]   Meriel Pica, MD 08/23/2016 2:02 PM

## 2016-08-25 NOTE — H&P (Signed)
NAMMarland Kitchen:  Donna Rivera, Donna Rivera            ACCOUNT NO.:  [id number]  MEDICAL RECORD NO.:  [id number]  LOCATION:                                 FACILITY:  PHYSICIAN:  Donna Rivera, M.D.    DATE OF BIRTH:  DATE OF ADMISSION:  09/11/2016 DATE OF DISCHARGE:                             HISTORY & PHYSICAL   CHIEF COMPLAINT:  Requests tubal ligation.  HISTORY OF PRESENT ILLNESS:  A 36 year old, 552, P452, has 36-year-old and 89- year-old, presents at this time for permanent sterilization.  The permanence of this procedure, failure rated 2-06/998, other risks related to laparoscopy including bleeding, infection, adjacent organ injury, along with her expected recovery time reviewed with her, which she understands and accepts.  PAST MEDICAL HISTORY:  Allergies:  PENICILLIN.  Current Medications:  Ibuprofen p.r.n.  Surgical History:  Two vaginal deliveries at term.  FAMILY HISTORY:  Significant for diabetes and grandfather with colon cancer.  SOCIAL HISTORY:  She is married.  Denies alcohol, tobacco, or drug use. Last Pap was normal.  PHYSICAL EXAMINATION:  VITAL SIGNS:  Temperature 98.2, blood pressure 120/72. HEENT:  Unremarkable. NECK:  Supple without masses. LUNGS:  Clear. CARDIOVASCULAR:  Regular rate and rhythm without murmurs, rubs, or gallops noted. BREASTS:  Without masses. ABDOMEN:  Soft, flat, nontender.  Vulva, vagina, cervix normal.  Uterus mid position, normal size.  Adnexa negative. EXTREMITIES:  Unremarkable. NEUROLOGIC:  Unremarkable.  IMPRESSION:  Normal exam, request permanent sterilization.  PLAN:  Laparoscopic tubal with Filshie clips.  Procedure and risks reviewed as above.     Harlan Vinal M. Marcelle Rivera, M.D.     [facility]/MEDQ  D:  08/23/2016  T:  08/23/2016  Job:  [id number]

## 2016-08-31 NOTE — Patient Instructions (Signed)
Your procedure is scheduled on:  Monday, May 21. 2018  Enter through the Main [facility]trance of [facility] at:  6:00 AM  Pick up the phone at the desk and dial [phone].  Call this number if you have problems the morning of surgery: [phone].  Remember: Do NOT eat food or drink after:  Midnight Sunday  Take these medicines the morning of surgery with a SIP OF WATER:  None  Stop ALL herbal medications at this time  Do NOT smoke the day of surgery.  Do NOT wear jewelry (body piercing), metal hair clips/bobby pins, make-up, or nail polish. Do NOT wear lotions, powders, or perfumes.  You may wear deodorant. Do NOT shave for 48 hours prior to surgery. Do NOT bring valuables to the hospital. Contacts, dentures, or bridgework may not be worn into surgery.  Have a responsible adult drive you home and stay with you for 24 hours after your procedure  Bring a copy of your healthcare power of attorney and living will documents.

## 2016-09-01 ENCOUNTER — Encounter (HOSPITAL_COMMUNITY)
Admission: RE | Admit: 2016-09-01 | Discharge: 2016-09-01 | Disposition: A | Discharge status: Home / Self Care | Payer: BLUE CROSS/BLUE SHIELD | Source: Ambulatory Visit | Attending: Obstetrics and Gynecology | Admitting: Obstetrics and Gynecology

## 2016-09-01 ENCOUNTER — Encounter (HOSPITAL_COMMUNITY): Payer: Self-pay

## 2016-09-01 DIAGNOSIS — Z01812 Encounter for preprocedural laboratory examination: Secondary | ICD-10-CM | POA: Insufficient documentation

## 2016-09-01 HISTORY — DX: Anesthesia of skin: R20.0

## 2016-09-01 HISTORY — DX: Headache, unspecified: R51.9

## 2016-09-01 HISTORY — DX: Adverse effect of unspecified anesthetic, initial encounter: T41.45XA

## 2016-09-01 HISTORY — DX: Paresthesia of skin: R20.2

## 2016-09-01 HISTORY — DX: Headache: R51

## 2016-09-01 LAB — TYPE AND SCREEN
ABO/RH(D): A NEG
Antibody Screen: NEGATIVE

## 2016-09-01 LAB — CBC
HCT: 40 % (ref 36.0–46.0)
HEMOGLOBIN: 13.8 g/dL (ref 12.0–15.0)
MCH: 30.7 pg (ref 26.0–34.0)
MCHC: 34.5 g/dL (ref 30.0–36.0)
MCV: 88.9 fL (ref 78.0–100.0)
Platelets: 277 10*3/uL (ref 150–400)
RBC: 4.5 MIL/uL (ref 3.87–5.11)
RDW: 13 % (ref 11.5–15.5)
WBC: 9.9 10*3/uL (ref 4.0–10.5)

## 2016-09-11 ENCOUNTER — Encounter (HOSPITAL_COMMUNITY): Payer: Self-pay

## 2016-09-11 ENCOUNTER — Ambulatory Visit (HOSPITAL_COMMUNITY): Payer: BLUE CROSS/BLUE SHIELD | Admitting: Anesthesiology

## 2016-09-11 ENCOUNTER — Ambulatory Visit (HOSPITAL_COMMUNITY)
Admission: RE | Admit: 2016-09-11 | Discharge: 2016-09-11 | Disposition: A | Discharge status: Home / Self Care | Payer: BLUE CROSS/BLUE SHIELD | Source: Ambulatory Visit | Attending: Obstetrics and Gynecology | Admitting: Obstetrics and Gynecology

## 2016-09-11 ENCOUNTER — Encounter (HOSPITAL_COMMUNITY)
Admission: RE | Disposition: A | Discharge status: Home / Self Care | Payer: Self-pay | Source: Ambulatory Visit | Attending: Obstetrics and Gynecology

## 2016-09-11 DIAGNOSIS — N803 Endometriosis of pelvic peritoneum: Secondary | ICD-10-CM | POA: Diagnosis not present

## 2016-09-11 DIAGNOSIS — Z880 Allergy status to penicillin: Secondary | ICD-10-CM | POA: Insufficient documentation

## 2016-09-11 DIAGNOSIS — Z302 Encounter for sterilization: Secondary | ICD-10-CM | POA: Diagnosis not present

## 2016-09-11 HISTORY — PX: LAPAROSCOPIC TUBAL LIGATION: SHX1937

## 2016-09-11 LAB — PREGNANCY, URINE: Preg Test, Ur: NEGATIVE

## 2016-09-11 SURGERY — LIGATION, FALLOPIAN TUBE, LAPAROSCOPIC
Anesthesia: General | Site: Abdomen | Laterality: Bilateral

## 2016-09-11 MED ORDER — SCOPOLAMINE 1 MG/3DAYS TD PT72
MEDICATED_PATCH | TRANSDERMAL | Status: AC
  Administered 2016-09-11: 1.5 mg via TRANSDERMAL
  Filled 2016-09-11: qty 1

## 2016-09-11 MED ORDER — LIDOCAINE HCL (CARDIAC) 20 MG/ML IV SOLN
INTRAVENOUS | Status: AC
Start: 2016-09-11 — End: 2016-09-11
  Filled 2016-09-11: qty 5

## 2016-09-11 MED ORDER — GLYCOPYRROLATE 0.2 MG/ML IJ SOLN
INTRAMUSCULAR | Status: DC | PRN
  Administered 2016-09-11: 0.2 mg via INTRAVENOUS

## 2016-09-11 MED ORDER — FENTANYL CITRATE (PF) 100 MCG/2ML IJ SOLN: 25.0000 ug | INTRAMUSCULAR | Status: DC | PRN

## 2016-09-11 MED ORDER — DEXAMETHASONE SODIUM PHOSPHATE 10 MG/ML IJ SOLN
INTRAMUSCULAR | Status: DC | PRN
  Administered 2016-09-11: 10 mg via INTRAVENOUS

## 2016-09-11 MED ORDER — DEXAMETHASONE SODIUM PHOSPHATE 10 MG/ML IJ SOLN
INTRAMUSCULAR | Status: AC
  Filled 2016-09-11: qty 1

## 2016-09-11 MED ORDER — ONDANSETRON HCL 4 MG/2ML IJ SOLN
INTRAMUSCULAR | Status: AC
  Filled 2016-09-11: qty 2

## 2016-09-11 MED ORDER — SODIUM CHLORIDE 0.9 % IJ SOLN
INTRAMUSCULAR | Status: AC
  Filled 2016-09-11: qty 10

## 2016-09-11 MED ORDER — MIDAZOLAM HCL 2 MG/2ML IJ SOLN
INTRAMUSCULAR | Status: AC
  Filled 2016-09-11: qty 2

## 2016-09-11 MED ORDER — MIDAZOLAM HCL 2 MG/2ML IJ SOLN
INTRAMUSCULAR | Status: DC | PRN
  Administered 2016-09-11: 2 mg via INTRAVENOUS

## 2016-09-11 MED ORDER — EPHEDRINE SULFATE 50 MG/ML IJ SOLN
INTRAMUSCULAR | Status: DC | PRN
  Administered 2016-09-11: 10 mg via INTRAVENOUS

## 2016-09-11 MED ORDER — LIDOCAINE HCL 1 % IJ SOLN
INTRAMUSCULAR | Status: AC
  Filled 2016-09-11: qty 10

## 2016-09-11 MED ORDER — DEXTROSE IN LACTATED RINGERS 5 % IV SOLN: INTRAVENOUS | Status: DC

## 2016-09-11 MED ORDER — BUPIVACAINE HCL (PF) 0.25 % IJ SOLN
INTRAMUSCULAR | Status: AC
  Filled 2016-09-11: qty 30

## 2016-09-11 MED ORDER — EPHEDRINE 5 MG/ML INJ
INTRAVENOUS | Status: AC
  Filled 2016-09-11: qty 10

## 2016-09-11 MED ORDER — KETOROLAC TROMETHAMINE 30 MG/ML IJ SOLN
INTRAMUSCULAR | Status: AC
  Filled 2016-09-11: qty 1

## 2016-09-11 MED ORDER — SUCCINYLCHOLINE CHLORIDE 200 MG/10ML IV SOSY
PREFILLED_SYRINGE | INTRAVENOUS | Status: AC
  Filled 2016-09-11: qty 10

## 2016-09-11 MED ORDER — PROPOFOL 10 MG/ML IV BOLUS
INTRAVENOUS | Status: DC | PRN
  Administered 2016-09-11: 180 mg via INTRAVENOUS

## 2016-09-11 MED ORDER — LACTATED RINGERS IV SOLN
INTRAVENOUS | Status: DC
  Administered 2016-09-11 (×2): via INTRAVENOUS

## 2016-09-11 MED ORDER — FENTANYL CITRATE (PF) 100 MCG/2ML IJ SOLN
INTRAMUSCULAR | Status: DC | PRN
  Administered 2016-09-11 (×3): 50 ug via INTRAVENOUS
  Administered 2016-09-11: 100 ug via INTRAVENOUS

## 2016-09-11 MED ORDER — SCOPOLAMINE 1 MG/3DAYS TD PT72
1.0000 | MEDICATED_PATCH | Freq: Once | TRANSDERMAL | Status: DC
  Administered 2016-09-11: 1.5 mg via TRANSDERMAL

## 2016-09-11 MED ORDER — OXYCODONE-ACETAMINOPHEN 5-325 MG PO TABS: 1.0000 | ORAL_TABLET | Freq: Four times a day (QID) | ORAL | 0 refills | Status: AC | PRN

## 2016-09-11 MED ORDER — SUCCINYLCHOLINE CHLORIDE 20 MG/ML IJ SOLN
INTRAMUSCULAR | Status: DC | PRN
  Administered 2016-09-11: 140 mg via INTRAVENOUS

## 2016-09-11 MED ORDER — KETOROLAC TROMETHAMINE 30 MG/ML IJ SOLN
INTRAMUSCULAR | Status: DC | PRN
  Administered 2016-09-11: 30 mg via INTRAVENOUS

## 2016-09-11 MED ORDER — LIDOCAINE HCL (CARDIAC) 20 MG/ML IV SOLN
INTRAVENOUS | Status: DC | PRN
  Administered 2016-09-11: 100 mg via INTRAVENOUS

## 2016-09-11 MED ORDER — BUPIVACAINE HCL (PF) 0.25 % IJ SOLN
INTRAMUSCULAR | Status: DC | PRN
  Administered 2016-09-11: 28 mL

## 2016-09-11 MED ORDER — NEOSTIGMINE METHYLSULFATE 10 MG/10ML IV SOLN
INTRAVENOUS | Status: DC | PRN
  Administered 2016-09-11: 2 mg via INTRAVENOUS

## 2016-09-11 MED ORDER — PROPOFOL 10 MG/ML IV BOLUS
INTRAVENOUS | Status: AC
  Filled 2016-09-11: qty 20

## 2016-09-11 MED ORDER — FENTANYL CITRATE (PF) 250 MCG/5ML IJ SOLN
INTRAMUSCULAR | Status: AC
Start: 2016-09-11 — End: 2016-09-11
  Filled 2016-09-11: qty 5

## 2016-09-11 MED ORDER — ROCURONIUM BROMIDE 100 MG/10ML IV SOLN
INTRAVENOUS | Status: DC | PRN
  Administered 2016-09-11: 10 mg via INTRAVENOUS
  Administered 2016-09-11: 5 mg via INTRAVENOUS

## 2016-09-11 MED ORDER — ONDANSETRON HCL 4 MG/2ML IJ SOLN
INTRAMUSCULAR | Status: DC | PRN
  Administered 2016-09-11: 4 mg via INTRAVENOUS

## 2016-09-11 MED ORDER — ROCURONIUM BROMIDE 100 MG/10ML IV SOLN
INTRAVENOUS | Status: AC
  Filled 2016-09-11: qty 1

## 2016-09-11 SURGICAL SUPPLY — 27 items
CABLE HIGH FREQUENCY MONO STRZ (ELECTRODE) IMPLANT
CATH ROBINSON RED A/P 16FR (CATHETERS) ×3 IMPLANT
CLIP FILSHIE TUBAL LIGA STRL (Clip) ×3 IMPLANT
CLOSURE WOUND 1/4 X3 (GAUZE/BANDAGES/DRESSINGS) ×1
CLOTH BEACON ORANGE TIMEOUT ST (SAFETY) ×3 IMPLANT
DERMABOND ADVANCED (GAUZE/BANDAGES/DRESSINGS) ×2
DERMABOND ADVANCED .7 DNX12 (GAUZE/BANDAGES/DRESSINGS) ×1 IMPLANT
DRSG OPSITE POSTOP 3X4 (GAUZE/BANDAGES/DRESSINGS) ×3 IMPLANT
GLOVE BIO SURGEON STRL SZ7 (GLOVE) ×3 IMPLANT
GLOVE BIOGEL PI IND STRL 7.0 (GLOVE) ×1 IMPLANT
GLOVE BIOGEL PI INDICATOR 7.0 (GLOVE) ×2
GOWN STRL REUS W/TWL LRG LVL3 (GOWN DISPOSABLE) ×6 IMPLANT
NEEDLE INSUFFLATION 120MM (ENDOMECHANICALS) ×3 IMPLANT
PACK LAPAROSCOPY BASIN (CUSTOM PROCEDURE TRAY) ×3 IMPLANT
PACK TRENDGUARD 450 HYBRID PRO (MISCELLANEOUS) ×1 IMPLANT
PACK TRENDGUARD 600 HYBRD PROC (MISCELLANEOUS) IMPLANT
PROTECTOR NERVE ULNAR (MISCELLANEOUS) ×6 IMPLANT
STRIP CLOSURE SKIN 1/4X3 (GAUZE/BANDAGES/DRESSINGS) ×2 IMPLANT
SUT VIC AB 2-0 UR6 27 (SUTURE) ×3 IMPLANT
SUT VICRYL RAPIDE 4/0 PS 2 (SUTURE) ×3 IMPLANT
SYR 20CC LL (SYRINGE) ×3 IMPLANT
TOWEL OR 17X24 6PK STRL BLUE (TOWEL DISPOSABLE) ×6 IMPLANT
TRENDGUARD 450 HYBRID PRO PACK (MISCELLANEOUS) ×3
TRENDGUARD 600 HYBRID PROC PK (MISCELLANEOUS)
TROCAR XCEL DIL TIP R 11M (ENDOMECHANICALS) ×3 IMPLANT
TROCAR Z-THREAD BLADED 5X100MM (TROCAR) IMPLANT
WARMER LAPAROSCOPE (MISCELLANEOUS) ×3 IMPLANT

## 2016-09-11 NOTE — Transfer of Care (Signed)
Immediate Anesthesia Transfer of Care Note  Patient: Donna CheekHeather L Rivera  Procedure(s) Performed: Procedure(s): LAPAROSCOPIC TUBAL LIGATION with Filshie Clips (Bilateral)  Patient Location: PACU  Anesthesia Type:General  Level of Consciousness: awake, alert  and oriented  Airway & Oxygen Therapy: Patient Spontanous Breathing and Patient connected to nasal cannula oxygen  Post-op Assessment: Report given to RN, Post -op Vital signs reviewed and stable and Patient moving all extremities X 4  Post vital signs: Reviewed and stable  Last Vitals:  Vitals:   09/11/16 0616  BP: 124/84  Pulse: 75  Resp: 18  Temp: 36.3 C    Last Pain:  Vitals:   09/11/16 0616  TempSrc: Oral      Patients Stated Pain Goal: 3 (09/11/16 0616)  Complications: No apparent anesthesia complications

## 2016-09-11 NOTE — Anesthesia Preprocedure Evaluation (Addendum)
Anesthesia Evaluation  Patient identified by MRN, date of birth, ID band Patient awake    Reviewed: Allergy & Precautions  Airway Mallampati: II  TM Distance: >3 FB     Dental   Pulmonary neg pulmonary ROS,    breath sounds clear to auscultation       Cardiovascular negative cardio ROS   Rhythm:Regular Rate:Normal     Neuro/Psych  Headaches,    GI/Hepatic negative GI ROS, Neg liver ROS,   Endo/Other  neg diabetes  Renal/GU negative Renal ROS     Musculoskeletal   Abdominal   Peds  Hematology   Anesthesia Other Findings   Reproductive/Obstetrics                           Anesthesia Physical Anesthesia Plan  ASA: III  Anesthesia Plan: General   Post-op Pain Management:    Induction: Intravenous  Airway Management Planned: Oral ETT  Additional Equipment:   Intra-op Plan:   Post-operative Plan: Extubation in OR  Informed Consent: I have reviewed the patients History and Physical, chart, labs and discussed the procedure including the risks, benefits and alternatives for the proposed anesthesia with the patient or authorized representative who has indicated his/her understanding and acceptance.   Dental advisory given  Plan Discussed with: CRNA and Anesthesiologist  Anesthesia Plan Comments:        Anesthesia Quick Evaluation

## 2016-09-11 NOTE — Discharge Instructions (Signed)
Laparoscopic Tubal Ligation, Care After Refer to this sheet in the next few weeks. These instructions provide you with information about caring for yourself after your procedure. Your health care provider may also give you more specific instructions. Your treatment has been planned according to current medical practices, but problems sometimes occur. Call your health care provider if you have any problems or questions after your procedure. What can I expect after the procedure? After the procedure, it is common to have:  A sore throat.  Discomfort in your shoulder.  Mild discomfort or cramping in your abdomen.  Gas pains.  Pain or soreness in the area where the surgical cut (incision) was made.  A bloated feeling.  Tiredness.  Nausea.  Vomiting. Follow these instructions at home: Medicines   Take over-the-counter and prescription medicines only as told by your health care provider.  Do not take aspirin because it can cause bleeding.  Do not drive or operate heavy machinery while taking prescription pain medicine. Activity   Rest for the rest of the day.  Return to your normal activities as told by your health care provider. Ask your health care provider what activities are safe for you. Incision care    Follow instructions from your health care provider about how to take care of your incision. Make sure you:  Wash your hands with soap and water before you change your bandage (dressing). If soap and water are not available, use hand sanitizer.  Change your dressing as told by your health care provider.  Leave stitches (sutures) in place. They may need to stay in place for 2 weeks or longer.  Check your incision area every day for signs of infection. Check for:  More redness, swelling, or pain.  More fluid or blood.  Warmth.  Pus or a bad smell. Other Instructions   Do not take baths, swim, or use a hot tub until your health care provider approves. You may take  showers.  Keep all follow-up visits as told by your health care provider. This is important.  Have someone help you with your daily household tasks for the first few days. Contact a health care provider if:  You have more redness, swelling, or pain around your incision.  Your incision feels warm to the touch.  You have pus or a bad smell coming from your incision.  The edges of your incision break open after the sutures have been removed.  Your pain does not improve after 2-3 days.  You have a rash.  You repeatedly become dizzy or light-headed.  Your pain medicine is not helping.  You are constipated. Get help right away if:  You have a fever.  You faint.  You have increasing pain in your abdomen.  You have severe pain in one or both of your shoulders.  You have fluid or blood coming from your sutures or from your vagina.  You have shortness of breath or difficulty breathing.  You have chest pain or leg pain.  You have ongoing nausea, vomiting, or diarrhea. This information is not intended to replace advice given to you by your health care provider. Make sure you discuss any questions you have with your health care provider. Document Released: 10/28/2004 Document Revised: 09/13/2015 Document Reviewed: 03/21/2015 Elsevier Interactive Patient Education  2017 [place] [facility] Instructions  Activity: Get plenty of rest for the remainder of the day. A responsible individual must stay with you for 24 hours following the  procedure.  For the next 24 hours, DO NOT: -Drive a car -Advertising copywriterperate machinery -Drink alcoholic beverages -Take any medication unless instructed by your physician -Make any legal decisions or sign important papers.  Meals: Start with liquid foods such as gelatin or soup. Progress to regular foods as tolerated. Avoid greasy, spicy, heavy foods. If nausea and/or vomiting occur, drink only clear liquids until the nausea and/or  vomiting subsides. Call your physician if vomiting continues.  Special Instructions/Symptoms: Your throat may feel dry or sore from the anesthesia or the breathing tube placed in your throat during surgery. If this causes discomfort, gargle with warm salt water. The discomfort should disappear within 24 hours.  If you had a scopolamine patch placed behind your ear for the management of post- operative nausea and/or vomiting:  1. The medication in the patch is effective for 72 hours, after which it should be removed.  Wrap patch in a tissue and discard in the trash. Wash hands thoroughly with soap and water. 2. You may remove the patch earlier than 72 hours if you experience unpleasant side effects which may include dry mouth, dizziness or visual disturbances. 3. Avoid touching the patch. Wash your hands with soap and water after contact with the patch.     NO IBUPROFEN PRODUCTS (MOTRIN, ADVIL) OR ALEVE UNTIL 2:00PM.

## 2016-09-11 NOTE — Op Note (Signed)
Preoperative diagnosis: Request permanent sterilization  Postoperative diagnosis: Same, cul-de-sac pelvic endometriosis  Surgeon: Marcelle OverlieHolland  Anesthesia: Gen.  EBL: Less than 10 cc  Procedure and findings:  Patient taken the operating room after an adequate level of general anesthesia was obtained with the patient in stirrups the abdomen perineum and vagina were prepped and draped and the bladder was drained. Appropriate timeouts were taken at that point. Hulka tenaculum was positioned. Attention directed to the abdomen where 2 cm subumbilical incision was made after quarter percent Marcaine plain used to infiltrate. Its intra-abdominal position was verified by pressure water testing. After a 3 L pneumoperitoneum syncopated, lap scopic trocar and sleeve were then introduced that difficulty. There was no evidence of any bleeding or trauma. Patient was then placed in Trendelenburg and the uterus anteflexed pelvic findings as follows:  Uterus itself was normal size bilateral tubes and ovaries unremarkable in the cul-de-sac on each uterosacral ligament there was evidence of some old scarring and minimal pelvic endometriosis. This was the only area noted. Upper abdomen otherwise unremarkable.  Quarter percent Marcaine plain was then dripped across each tube from the cornu to the fimbriated end. The Filshie clip applicator was backloaded, right tube traced from origin to fimbriated end, was grasped 2 cm from the cornu at a right ankle completely engulfing the tube, the clip was applied with excellent application. The exact same repeated on the left. Pictures were taken at this point. Instrument was removed, gas allowed to escape 4-0 Monocryl subcuticular skin closure she tolerated this well went to recovery room in good condition.  Dictated with [facility]  Jolin Benavides Milana ObeyM Gael Delude M.D.

## 2016-09-11 NOTE — Progress Notes (Signed)
The patient was re-examined with no change in status 

## 2016-09-11 NOTE — Anesthesia Postprocedure Evaluation (Signed)
Anesthesia Post Note  Patient: Donna CheekHeather L Diana  Procedure(s) Performed: Procedure(s) (LRB): LAPAROSCOPIC TUBAL LIGATION with Filshie Clips (Bilateral)  Patient location during evaluation: PACU Anesthesia Type: General Level of consciousness: awake and alert Pain management: pain level controlled Vital Signs Assessment: post-procedure vital signs reviewed and stable Respiratory status: spontaneous breathing, nonlabored ventilation and respiratory function stable Cardiovascular status: blood pressure returned to baseline and stable Postop Assessment: no signs of nausea or vomiting Anesthetic complications: no        Last Vitals:  Vitals:   09/11/16 0915 09/11/16 0930  BP: (!) 119/107   Pulse: 66   Resp: 13   Temp:  (P) 36.6 C    Last Pain:  Vitals:   09/11/16 0930  TempSrc:   PainSc: (P) 2    Pain Goal: Patients Stated Pain Goal: 3 (09/11/16 0915)               Lowella CurbWarren Ray Brodan Grewell

## 2016-09-11 NOTE — Anesthesia Procedure Notes (Signed)
Procedure Name: Intubation Date/Time: 09/11/2016 7:25 AM Performed by: Casimer Lanius A Pre-anesthesia Checklist: Patient identified, Emergency Drugs available, Suction available and Patient being monitored Patient Re-evaluated:Patient Re-evaluated prior to inductionOxygen Delivery Method: Circle system utilized and Simple face mask Preoxygenation: Pre-oxygenation with 100% oxygen Intubation Type: IV induction and Cricoid Pressure applied Ventilation: Mask ventilation without difficulty Laryngoscope Size: Mac and 3 Grade View: Grade II Tube type: Oral Tube size: 7.0 mm Number of attempts: 1 Airway Equipment and Method: Stylet Placement Confirmation: ETT inserted through vocal cords under direct vision,  positive ETCO2 and breath sounds checked- equal and bilateral Secured at: 22 (right lip) cm Tube secured with: Tape Dental Injury: Teeth and Oropharynx as per pre-operative assessment

## 2016-09-12 ENCOUNTER — Encounter (HOSPITAL_COMMUNITY): Payer: Self-pay | Admitting: Obstetrics and Gynecology
